# Patient Record
Sex: Male | Born: 2005 | Race: Black or African American | Hispanic: No | Marital: Single | Smoking: Never smoker
Health system: Southern US, Community
[De-identification: ages and names within clinical notes are randomized; demographics above are authoritative.]

## PROBLEM LIST (undated history)

## (undated) DIAGNOSIS — F32A Depression, unspecified: Secondary | ICD-10-CM

## (undated) DIAGNOSIS — F419 Anxiety disorder, unspecified: Secondary | ICD-10-CM

## (undated) DIAGNOSIS — T7840XA Allergy, unspecified, initial encounter: Secondary | ICD-10-CM

## (undated) DIAGNOSIS — H6690 Otitis media, unspecified, unspecified ear: Secondary | ICD-10-CM

## (undated) HISTORY — DX: Anxiety disorder, unspecified: F41.9

## (undated) HISTORY — DX: Depression, unspecified: F32.A

## (undated) HISTORY — DX: Allergy, unspecified, initial encounter: T78.40XA

## (undated) HISTORY — DX: Otitis media, unspecified, unspecified ear: H66.90

---
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
## 2024-02-12 ENCOUNTER — Ambulatory Visit: Admitting: Family Medicine

## 2024-03-04 ENCOUNTER — Ambulatory Visit: Admitting: Family Medicine
